# Patient Record
Sex: Male | Born: 1995 | Race: Black or African American | Hispanic: No | Marital: Single | Smoking: Never smoker
Health system: Southern US, Community
[De-identification: ages and names within clinical notes are randomized; demographics above are authoritative.]

---
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
## 2019-06-23 ENCOUNTER — Encounter (HOSPITAL_COMMUNITY): Payer: Self-pay | Admitting: Emergency Medicine

## 2019-06-23 ENCOUNTER — Emergency Department (HOSPITAL_COMMUNITY)
Admission: EM | Admit: 2019-06-23 | Discharge: 2019-06-24 | Disposition: A | Discharge status: Home / Self Care | Payer: BC Managed Care – PPO | Attending: Emergency Medicine | Admitting: Emergency Medicine

## 2019-06-23 ENCOUNTER — Other Ambulatory Visit: Payer: Self-pay

## 2019-06-23 ENCOUNTER — Emergency Department (HOSPITAL_COMMUNITY): Payer: BC Managed Care – PPO

## 2019-06-23 DIAGNOSIS — R05 Cough: Secondary | ICD-10-CM | POA: Diagnosis present

## 2019-06-23 DIAGNOSIS — R0602 Shortness of breath: Secondary | ICD-10-CM | POA: Insufficient documentation

## 2019-06-23 DIAGNOSIS — Z5321 Procedure and treatment not carried out due to patient leaving prior to being seen by health care provider: Secondary | ICD-10-CM | POA: Insufficient documentation

## 2019-06-23 NOTE — ED Triage Notes (Signed)
Pt c/o cough with phlegm-clear and yellow and sob that started today. Pt reports scratchy throat today as well.

## 2019-06-24 NOTE — ED Notes (Signed)
Pt called 3X for room call. No answer. Eloped from waiting area. VS stable.

## 2019-10-01 ENCOUNTER — Ambulatory Visit: Payer: BC Managed Care – PPO | Attending: Family

## 2019-10-01 DIAGNOSIS — Z23 Encounter for immunization: Secondary | ICD-10-CM

## 2019-10-22 ENCOUNTER — Ambulatory Visit: Payer: BC Managed Care – PPO | Attending: Family

## 2019-10-22 DIAGNOSIS — Z23 Encounter for immunization: Secondary | ICD-10-CM

## 2019-10-25 NOTE — Progress Notes (Signed)
   Covid-19 Vaccination Clinic  Name:  RINGO SHEROD    MRN: [id number] DOB: [birth year]  10/25/2019  Mr. Callaham was observed post Covid-19 immunization for 15 minutes without incident. He was provided with Vaccine Information Sheet and instruction to access the V-Safe system.   Mr. Alexopoulos was instructed to call 911 with any severe reactions post vaccine: Marland Kitchen Difficulty breathing  . Swelling of face and throat  . A fast heartbeat  . A bad rash all over body  . Dizziness and weakness   Immunizations Administered    Name Date Dose VIS Date Route   Pfizer COVID-19 Vaccine 10/01/2019 10:00 AM 0.3 mL 03/11/2018 Intramuscular   Manufacturer: [employer], [employer]   Lot: [id number]   NDC: [address]-3

## 2019-11-26 NOTE — Progress Notes (Signed)
   Covid-19 Vaccination Clinic  Name:  Joel Davidson    MRN: [id number] DOB: 02/18/1995  11/26/2019  [name] was observed post Covid-19 immunization for 15 minutes without incident. He was provided with Vaccine Information Sheet and instruction to access the V-Safe system.   [name] was instructed to call 911 with any severe reactions post vaccine: Marland Kitchen Difficulty breathing  . Swelling of face and throat  . A fast heartbeat  . A bad rash all over body  . Dizziness and weakness   Immunizations Administered    Name Date Dose VIS Date Route   Pfizer COVID-19 Vaccine 10/22/2019  1:00 PM 0.3 mL 11/04/2019 Intramuscular   Manufacturer: [employer], [employer]   Lot: [id number]   NDC: [address]-8

## 2020-05-20 ENCOUNTER — Other Ambulatory Visit: Payer: Self-pay

## 2020-05-20 ENCOUNTER — Ambulatory Visit
Admission: RE | Admit: 2020-05-20 | Discharge: 2020-05-20 | Disposition: A | Discharge status: Home / Self Care | Payer: BC Managed Care – PPO | Source: Ambulatory Visit | Attending: Internal Medicine | Admitting: Internal Medicine

## 2020-05-20 ENCOUNTER — Other Ambulatory Visit: Payer: Self-pay | Admitting: Internal Medicine

## 2020-05-20 DIAGNOSIS — M79642 Pain in left hand: Secondary | ICD-10-CM

## 2021-11-11 IMAGING — CR DG WRIST 2V*L*
2 series · 2 of 2 positions shown · non-contrast
Comparison: None.

CLINICAL DATA: Left wrist pain after fall 1 week prior playing
basketball

EXAM:
LEFT WRIST - 2 VIEW

[x wrist pa left]
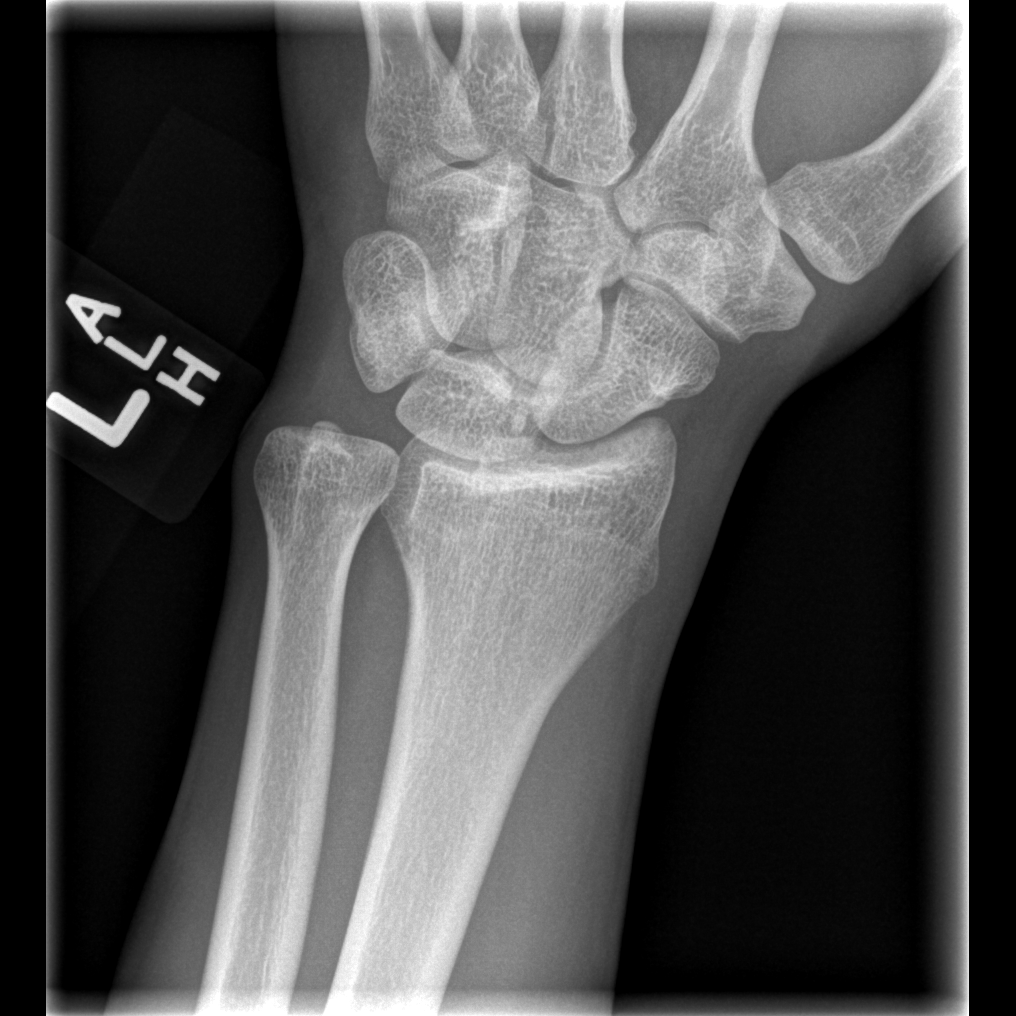

[x wrist lat left]
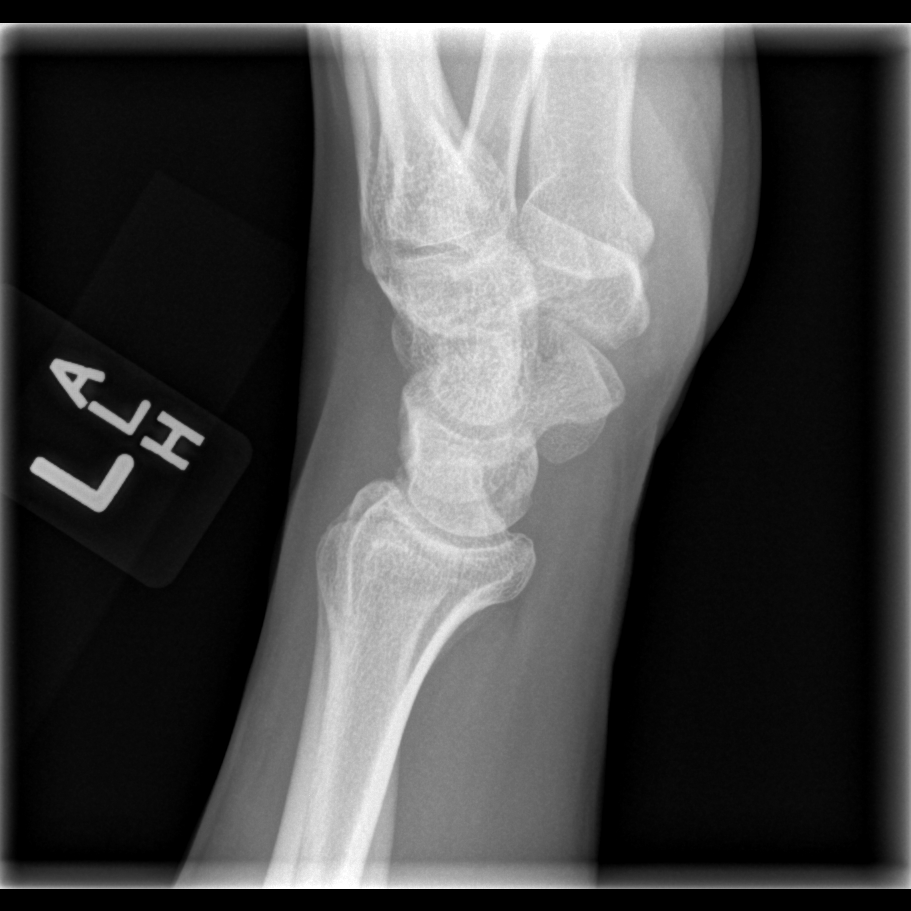

[2 of 2 positions shown; findings below may reference images not displayed]

FINDINGS: There is no evidence of fracture or dislocation. There is no
evidence of arthropathy or other focal bone abnormality. Soft
tissues are unremarkable.
IMPRESSION: No acute osseous abnormality in the left wrist.

## 2022-02-08 ENCOUNTER — Ambulatory Visit: Payer: Self-pay

## 2022-02-08 NOTE — Telephone Encounter (Signed)
   Chief Complaint: tooth pain, facial swelling Symptoms: pain in tooth, facial swelling- R eye swelling- patient believes he has a sinus infection Frequency: started Friday Pertinent Negatives: Patient denies fever Disposition: [] ED /[x] Urgent Care (no appt availability in office) / [] Appointment(In office/virtual)/ []  [facility]/ [] Home Care/ [] Refused Recommended Disposition /[] [facility]/ []  Follow-up with PCP Additional Notes: Patient advised to call PCP for appointment- if no available appointment then UC would be option   Reason for Disposition  Face is very swollen  Answer Assessment - Initial Assessment Questions 1. LOCATION: "Which tooth is hurting?"  (e.g., right-side/left-side, upper/lower, front/back)     R side of face swelling- toothache 2. ONSET: "When did the toothache start?"  (e.g., hours, days)      Last Friday started 3. SEVERITY: "How bad is the toothache?"  (Scale 1-10; mild, moderate or severe)   - MILD (1-3): doesn't interfere with chewing    - MODERATE (4-7): interferes with chewing, interferes with normal activities, awakens from sleep     - SEVERE (8-10): unable to eat, unable to do any normal activities, excruciating pain        moderate 4. SWELLING: "Is there any visible swelling of your face?"     Yes- swelling in face and eye- R 5. OTHER SYMPTOMS: "Do you have any other symptoms?" (e.g., fever)     Sinus symptoms  Protocols used: Toothache-A-AH

## 2022-02-08 NOTE — Telephone Encounter (Signed)
Summary: Swelling right side of face and eye   Pt is calling to report swelling in the the right side of his face, tooth ache in the front right side of mouth, with right eye swelling, with no pain anywhere. Please advise        Called pt at both numbers - [facility]

## 2022-02-12 ENCOUNTER — Other Ambulatory Visit (HOSPITAL_COMMUNITY): Payer: Self-pay

## 2022-02-12 ENCOUNTER — Emergency Department (HOSPITAL_COMMUNITY)
Admission: EM | Admit: 2022-02-12 | Discharge: 2022-02-12 | Disposition: A | Discharge status: Home / Self Care | Payer: BC Managed Care – PPO | Attending: Emergency Medicine | Admitting: Emergency Medicine

## 2022-02-12 ENCOUNTER — Encounter (HOSPITAL_COMMUNITY): Payer: Self-pay | Admitting: *Deleted

## 2022-02-12 ENCOUNTER — Other Ambulatory Visit: Payer: Self-pay

## 2022-02-12 DIAGNOSIS — R0981 Nasal congestion: Secondary | ICD-10-CM

## 2022-02-12 DIAGNOSIS — J0101 Acute recurrent maxillary sinusitis: Secondary | ICD-10-CM | POA: Diagnosis not present

## 2022-02-12 DIAGNOSIS — R220 Localized swelling, mass and lump, head: Secondary | ICD-10-CM | POA: Diagnosis present

## 2022-02-12 MED ORDER — AMOXICILLIN-POT CLAVULANATE 875-125 MG PO TABS
1.0000 | ORAL_TABLET | Freq: Two times a day (BID) | ORAL | 0 refills | Status: DC
  Filled 2022-02-12: qty 14, 7d supply, fill #0

## 2022-02-12 NOTE — ED Provider Notes (Signed)
[facility] Provider Note   CSN: [id number] Arrival date & time: 02/12/22  0093     History  Chief Complaint  Patient presents with   Facial Pain    Joel Davidson is a 27 y.o. male.  Joel Davidson is a 27 year old male past medical history of remote right-sided facial abscess presenting to the emergency department for intermittent right-sided facial swelling.  Patient states that over the past 4 weeks he has had intermittent right-sided nasal congestion that has gradually spread to include his sinuses on the right side of his face.  Denies fever or chills.  He states that he has been taking Zyrtec with little relief.  He states that he has had some clear discharge from the right nare.  Denies any trauma to the face.  Patient denies any focal neurological deficits denies any right-sided vision changes or hearing change to the right ear.         Home Medications Prior to Admission medications   Medication Sig Start Date End Date Taking? Authorizing Provider  amoxicillin-clavulanate (AUGMENTIN) 875-125 MG tablet Take 1 tablet by mouth 2 (two) times daily. 02/12/22  Yes Donzetta Matters, MD      Allergies    Patient has no known allergies.    Review of Systems   Review of Systems  Physical Exam Updated Vital Signs BP (!) 144/84   Pulse 83   Temp 98.1 F (36.7 C) (Oral)   Resp 16   SpO2 96%  Physical Exam Vitals and nursing note reviewed.  Constitutional:      General: He is not in acute distress.    Appearance: He is well-developed.  HENT:     Head: Normocephalic and atraumatic.     Right Ear: Tympanic membrane normal.     Left Ear: Tympanic membrane normal.     Nose: Congestion and rhinorrhea present.  Eyes:     Conjunctiva/sclera: Conjunctivae normal.  Cardiovascular:     Rate and Rhythm: Normal rate and regular rhythm.     Heart sounds: No murmur heard. Pulmonary:     Effort: Pulmonary effort is normal. No  respiratory distress.     Breath sounds: Normal breath sounds.  Abdominal:     Palpations: Abdomen is soft.     Tenderness: There is no abdominal tenderness.  Musculoskeletal:        General: No swelling.     Cervical back: Neck supple.  Skin:    General: Skin is warm and dry.     Capillary Refill: Capillary refill takes less than 2 seconds.  Neurological:     Mental Status: He is alert.  Psychiatric:        Mood and Affect: Mood normal.     ED Results / Procedures / Treatments   Labs (all labs ordered are listed, but only abnormal results are displayed) Labs Reviewed - No data to display  EKG None  Radiology No results found.  Procedures Procedures    Medications Ordered in ED Medications - No data to display  ED Course/ Medical Decision Making/ A&P                             Medical Decision Making Joel Davidson is an otherwise healthy 27 year old male past medical history as documented above presenting to the emergency department for right-sided facial pain and swelling.  On arrival to the emergency department patient is  slightly hypertensive, afebrile, nontachycardic satting 96% on room air.  On my initial assessment of the patient there is no frank swelling noted on the patient's right face.  On inspection of the patient's nose there is clear runny mucus in the right nare no evidence of large polyp or bleeding.  Patient's ear shows normal tympanic membrane no swelling or inflammation eyes are equal round and reactive no afferent pupillary defect noted patient's extraocular movements are grossly intact no scleral injection noted.  Patient symptoms are intermittent and mild and I believe this is a vascular issue such as SVC or Horner syndrome.  Seems more likely a allergic rhinitis given the clear nasal discharge however given the length of symptoms we are opting to treat the patient as an acute rhinosinusitis patient is already taking Zyrtec at home he was  instructed to begin taking Flonase and we will provide him with 7-day course of amoxicillin/clavulanate.  Ambulatory referral was placed to ENT.  Patient was generally well-appearing no vital sign or rales in the emergency department and otherwise healthy therefore discharged home with self-care.     Patient does have minor maxillary and sphenoid/ethmoid sinus tenderness on the right side.    Risk Prescription drug management.           Final Clinical Impression(s) / ED Diagnoses Final diagnoses:  Sinus congestion  Acute recurrent maxillary sinusitis    Rx / DC Orders ED Discharge Orders          Ordered    amoxicillin-clavulanate (AUGMENTIN) 875-125 MG tablet  2 times daily        02/12/22 1620              Donzetta Matters, MD 02/12/22 2359    Margette Fast, MD 02/13/22 [phone]

## 2022-02-12 NOTE — ED Notes (Signed)
DC instructions reviewed with pt. Pt verbalized understanding.  PT DC.  

## 2022-02-12 NOTE — Discharge Instructions (Addendum)
Please continue to monitor your symptoms, if you notice vision changes please return to the ED.  Please take Augmentin twice daily for the next 7 days.  Please begin using flonase twice daily Sincerely, Clydie Braun, PGY-2

## 2022-02-12 NOTE — ED Triage Notes (Signed)
Pt states since last Saturday his right side of the face has been swelling and now his right nostril he is unable to blow anything out of his right nostril

## 2023-09-30 ENCOUNTER — Other Ambulatory Visit: Payer: Self-pay

## 2023-09-30 ENCOUNTER — Encounter (HOSPITAL_BASED_OUTPATIENT_CLINIC_OR_DEPARTMENT_OTHER): Payer: Self-pay

## 2023-09-30 ENCOUNTER — Emergency Department (HOSPITAL_BASED_OUTPATIENT_CLINIC_OR_DEPARTMENT_OTHER): Payer: Self-pay | Admitting: Radiology

## 2023-09-30 ENCOUNTER — Emergency Department (HOSPITAL_BASED_OUTPATIENT_CLINIC_OR_DEPARTMENT_OTHER)
Admission: EM | Admit: 2023-09-30 | Discharge: 2023-09-30 | Disposition: A | Discharge status: Home / Self Care | Payer: Self-pay | Attending: Emergency Medicine | Admitting: Emergency Medicine

## 2023-09-30 DIAGNOSIS — M25532 Pain in left wrist: Secondary | ICD-10-CM | POA: Insufficient documentation

## 2023-09-30 DIAGNOSIS — W19XXXA Unspecified fall, initial encounter: Secondary | ICD-10-CM | POA: Insufficient documentation

## 2023-09-30 DIAGNOSIS — M79642 Pain in left hand: Secondary | ICD-10-CM | POA: Insufficient documentation

## 2023-09-30 DIAGNOSIS — Y9367 Activity, basketball: Secondary | ICD-10-CM | POA: Insufficient documentation

## 2023-09-30 MED ORDER — IBUPROFEN 400 MG PO TABS
600.0000 mg | ORAL_TABLET | Freq: Once | ORAL | Status: AC
  Administered 2023-09-30: 600 mg via ORAL
  Filled 2023-09-30: qty 1

## 2023-09-30 NOTE — ED Triage Notes (Signed)
 Pt c/o L hand pain, advises he was playing basketball 2wks ago, my L hand has been swollen since. +ROM, but the swelling hasn't gone down. Advsies that sometimes it hurts w movement, pulling pain

## 2023-09-30 NOTE — ED Provider Notes (Signed)
  [place] EMERGENCY DEPARTMENT AT [facility] Provider Note   CSN: [id number] Arrival date & time: 09/30/23  1115     Patient presents with: Hand Pain  HPI JAMONTA GOERNER is a 28 y.o. male presenting for left hand pain.  Started 2 weeks ago while playing basketball.  He states he lost his footing and fell forward landing on his left hand with it outstretched.  Has noted some persistent swelling and some pain primarily about the thenar eminence of the palm extending down past the wrist.  He states he still able to move it normally.  Has been icing it occasionally.    Hand Pain       Prior to Admission medications   Medication Sig Start Date End Date Taking? Authorizing Provider  amoxicillin -clavulanate (AUGMENTIN ) 875-125 MG tablet Take 1 tablet by mouth 2 (two) times daily. 02/12/22   Jackquline Chew, MD    Allergies: Patient has no known allergies.    Review of Systems See HPI  Updated Vital Signs BP 128/76 (BP Location: Right Arm)   Pulse 66   Temp 97.9 F (36.6 C)   Resp 16   SpO2 100%   Physical Exam Constitutional:      Appearance: Normal appearance.  HENT:     Head: Normocephalic.     Nose: Nose normal.  Eyes:     Conjunctiva/sclera: Conjunctivae normal.  Pulmonary:     Effort: Pulmonary effort is normal.  Musculoskeletal:     Comments: Left hand appears normal without swelling erythema or ecchymosis or obvious deformity.  Radial pulses 2+.  Cap refills brisk distally.  Sensation intact to light touch.  No snuffbox tenderness but there is some tenderness with palpation to the distal radial forearm.  Neurological:     Mental Status: He is alert.  Psychiatric:        Mood and Affect: Mood normal.     (all labs ordered are listed, but only abnormal results are displayed) Labs Reviewed - No data to display  EKG: None  Radiology: DG Hand Complete Left Result Date: 09/30/2023 CLINICAL DATA:  Left hand pain. EXAM: LEFT HAND - COMPLETE 3+  VIEW COMPARISON:  None Available. FINDINGS: There is no evidence of fracture or dislocation. Joint spaces are maintained. Soft tissues are unremarkable. IMPRESSION: Negative. Electronically Signed   By: Harrietta Sherry M.D.   On: 09/30/2023 12:04     Procedures   Medications Ordered in the ED  ibuprofen  (ADVIL ) tablet 600 mg (has no administration in time range)                                    Medical Decision Making Amount and/or Complexity of Data Reviewed Radiology: ordered.   28 year old well-appearing male presenting for left hand pain.  Exam is unremarkable.  It is possible he may have sustained a very mild wrist sprain.  Placed a brace.  Advised RICE treatment and NSAIDs at home and to follow-up with his PCP.  Discharged.     Final diagnoses:  Pain of left hand  Left wrist pain    ED Discharge Orders     None          Lang Norleen POUR, PA-C 09/30/23 1352    Geraldene Hamilton, MD 10/01/23 1254

## 2023-09-30 NOTE — Discharge Instructions (Signed)
 Evaluation today revealed that you may have possibly sprained your left wrist mildly.  Treatment is mostly supportive at home which includes rest, ice, compression and elevation and take ibuprofen  as well to help with the inflammation.  You can use the brace for comfort.  Please follow-up with your PCP.

## 2023-09-30 NOTE — ED Notes (Signed)

## 2023-12-12 ENCOUNTER — Emergency Department (HOSPITAL_COMMUNITY): Payer: Self-pay

## 2023-12-12 ENCOUNTER — Encounter (HOSPITAL_COMMUNITY): Payer: Self-pay

## 2023-12-12 ENCOUNTER — Emergency Department (HOSPITAL_COMMUNITY)
Admission: EM | Admit: 2023-12-12 | Discharge: 2023-12-12 | Disposition: A | Discharge status: Home / Self Care | Payer: Self-pay | Attending: Emergency Medicine | Admitting: Emergency Medicine

## 2023-12-12 ENCOUNTER — Other Ambulatory Visit: Payer: Self-pay

## 2023-12-12 DIAGNOSIS — L0291 Cutaneous abscess, unspecified: Secondary | ICD-10-CM

## 2023-12-12 DIAGNOSIS — K610 Anal abscess: Secondary | ICD-10-CM | POA: Insufficient documentation

## 2023-12-12 LAB — BASIC METABOLIC PANEL WITH GFR
Anion gap: 10 (ref 5–15)
BUN: 5 mg/dL — ABNORMAL LOW (ref 6–20)
CO2: 26 mmol/L (ref 22–32)
Calcium: 9.2 mg/dL (ref 8.9–10.3)
Chloride: 104 mmol/L (ref 98–111)
Creatinine, Ser: 0.99 mg/dL (ref 0.61–1.24)
GFR, Estimated: >60 mL/min (ref >60)
Glucose, Bld: 96 mg/dL (ref 70–99)
Potassium: 3.7 mmol/L (ref 3.5–5.1)
Sodium: 140 mmol/L (ref 135–145)

## 2023-12-12 LAB — CBC WITH DIFFERENTIAL/PLATELET
Abs Immature Granulocytes: 0.04 K/uL (ref 0.00–0.07)
Basophils Absolute: 0.1 K/uL (ref 0.0–0.1)
Basophils Relative: 1 %
Eosinophils Absolute: 0.1 K/uL (ref 0.0–0.5)
Eosinophils Relative: 1 %
HCT: 42.8 % (ref 39.0–52.0)
Hemoglobin: 14 g/dL (ref 13.0–17.0)
Immature Granulocytes: 1 %
Lymphocytes Relative: 21 %
Lymphs Abs: 1.7 K/uL (ref 0.7–4.0)
MCH: 29.8 pg (ref 26.0–34.0)
MCHC: 32.7 g/dL (ref 30.0–36.0)
MCV: 91.1 fL (ref 80.0–100.0)
Monocytes Absolute: 0.7 K/uL (ref 0.1–1.0)
Monocytes Relative: 8 %
Neutro Abs: 5.9 K/uL (ref 1.7–7.7)
Neutrophils Relative %: 68 %
Platelets: 349 K/uL (ref 150–400)
RBC: 4.7 MIL/uL (ref 4.22–5.81)
RDW: 12.5 % (ref 11.5–15.5)
WBC: 8.5 K/uL (ref 4.0–10.5)
nRBC: 0 % (ref 0.0–0.2)

## 2023-12-12 LAB — I-STAT CHEM 8, ED
BUN: 5 mg/dL — ABNORMAL LOW (ref 6–20)
Calcium, Ion: 1.2 mmol/L (ref 1.15–1.40)
Chloride: 104 mmol/L (ref 98–111)
Creatinine, Ser: 1 mg/dL (ref 0.61–1.24)
Glucose, Bld: 93 mg/dL (ref 70–99)
HCT: 41 % (ref 39.0–52.0)
Hemoglobin: 13.9 g/dL (ref 13.0–17.0)
Potassium: 3.8 mmol/L (ref 3.5–5.1)
Sodium: 142 mmol/L (ref 135–145)
TCO2: 26 mmol/L (ref 22–32)

## 2023-12-12 MED ORDER — AMOXICILLIN-POT CLAVULANATE 875-125 MG PO TABS
1.0000 | ORAL_TABLET | Freq: Once | ORAL | Status: DC
  Filled 2023-12-12: qty 1

## 2023-12-12 MED ORDER — IOHEXOL 350 MG/ML SOLN
75.0000 mL | Freq: Once | INTRAVENOUS | Status: AC | PRN
  Administered 2023-12-12: 75 mL via INTRAVENOUS

## 2023-12-12 MED ORDER — LIDOCAINE-EPINEPHRINE (PF) 2 %-1:200000 IJ SOLN
10.0000 mL | Freq: Once | INTRAMUSCULAR | Status: DC
  Filled 2023-12-12: qty 20

## 2023-12-12 MED ORDER — OXYCODONE HCL 5 MG PO TABS
5.0000 mg | ORAL_TABLET | Freq: Once | ORAL | Status: AC
Start: 2023-12-12 — End: 2023-12-12
  Administered 2023-12-12: 5 mg via ORAL
  Filled 2023-12-12: qty 1

## 2023-12-12 MED ORDER — AMOXICILLIN-POT CLAVULANATE 875-125 MG PO TABS: 1.0000 | ORAL_TABLET | Freq: Two times a day (BID) | ORAL | 0 refills | Status: AC

## 2023-12-12 NOTE — ED Provider Notes (Signed)
 [place] EMERGENCY DEPARTMENT AT [facility] Provider Note   CSN: [id number] Arrival date & time: 12/12/23  1119     Patient presents with: Abscess   Joel Davidson is a 28 y.o. male with noncontributory PMHx who presents to ED concern for abscess developing in intergluteal cleft over the past 3 days.  There is pain associated with this area.  Patient denies any other infectious symptoms such as fever, chest pain, SOB, nausea, vomiting, diarrhea.    Abscess      Prior to Admission medications   Medication Sig Start Date End Date Taking? Authorizing Provider  amoxicillin -clavulanate (AUGMENTIN ) 875-125 MG tablet Take 1 tablet by mouth every 12 (twelve) hours for 7 days. 12/12/23 12/19/23 Yes Hoy Nidia FALCON, PA-C    Allergies: Patient has no known allergies.    Review of Systems  Skin:        Abscess    Updated Vital Signs BP (!) 129/90 (BP Location: Left Arm)   Pulse 79   Temp 97.6 F (36.4 C) (Oral)   Resp 18   Ht 6' 3 (1.905 m)   Wt 79.4 kg   SpO2 100%   BMI 21.87 kg/m   Physical Exam Vitals and nursing note reviewed.  Constitutional:      General: He is not in acute distress.    Appearance: He is not ill-appearing or toxic-appearing.  HENT:     Head: Normocephalic and atraumatic.     Mouth/Throat:     Mouth: Mucous membranes are moist.  Eyes:     General: No scleral icterus.       Right eye: No discharge.        Left eye: No discharge.     Conjunctiva/sclera: Conjunctivae normal.  Cardiovascular:     Rate and Rhythm: Normal rate.     Pulses: Normal pulses.  Pulmonary:     Effort: Pulmonary effort is normal.  Musculoskeletal:     Right lower leg: No edema.     Left lower leg: No edema.  Skin:    General: Skin is warm and dry.     Findings: No rash.     Comments: 2-3 cm area of fluctuance between intragluteal cleft superior to rectum. No drainage or surrounding erythema.  Neurological:     General: No focal deficit present.      Mental Status: He is alert. Mental status is at baseline.  Psychiatric:        Mood and Affect: Mood normal.     (all labs ordered are listed, but only abnormal results are displayed) Labs Reviewed  BASIC METABOLIC PANEL WITH GFR - Abnormal; Notable for the following components:      Result Value   BUN 5 (*)    All other components within normal limits  I-STAT CHEM 8, ED - Abnormal; Notable for the following components:   BUN 5 (*)    All other components within normal limits  CBC WITH DIFFERENTIAL/PLATELET  GC/CHLAMYDIA PROBE AMP ([place]) NOT AT [facility]    EKG: None  Radiology: CT PELVIS W CONTRAST Result Date: 12/12/2023 CLINICAL DATA:  Suspicion for perianal fistula or abscess or pilonidal cyst. EXAM: CT PELVIS WITH CONTRAST TECHNIQUE: Multidetector CT imaging of the pelvis was performed using the standard protocol following the bolus administration of intravenous contrast. RADIATION DOSE REDUCTION: This exam was performed according to the departmental dose-optimization program which includes automated exposure control, adjustment of the mA and/or kV according to patient size and/or use  of iterative reconstruction technique. CONTRAST:  75mL OMNIPAQUE  IOHEXOL  350 MG/ML SOLN COMPARISON:  None Available. FINDINGS: Urinary Tract:  Normal bladder and visualized ureters. Bowel:  Visualized bowel within normal limits. Vascular/Lymphatic: No vascular abnormality. No enlarged lymph nodes. Reproductive:  Unremarkable. Other: Ill-defined low-attenuation collection extends posteriorly from the anus, well below the levator sling and below the coccyx, measuring approximately 3.7 x 1.9 x 3.3 cm. There is adjacent fat stranding and haziness consistent with inflammation. Musculoskeletal: Normal. IMPRESSION: 1. 3.7 cm ill-defined fluid collection with adjacent inflammation extending posteriorly from the anus consistent with a perianal abscess. Electronically Signed   By: Alm Parkins M.D.   On:  12/12/2023 13:19     .Incision and Drainage  Date/Time: 12/12/2023 2:57 PM  Performed by: Hoy Nidia FALCON, PA-C Authorized by: Hoy Nidia FALCON, PA-C   Consent:    Consent obtained:  Verbal   Consent given by:  Patient   Risks discussed:  Bleeding, damage to other organs, incomplete drainage, infection and pain   Alternatives discussed:  No treatment Universal protocol:    Procedure explained and questions answered to patient or proxy's satisfaction: yes     Patient identity confirmed:  Verbally with patient Location:    Type:  Abscess   Location:  Anogenital   Anogenital location:  Perianal Pre-procedure details:    Skin preparation:  Povidone-iodine Anesthesia:    Anesthesia method:  Local infiltration   Local anesthetic:  Lidocaine  2% WITH epi Procedure type:    Complexity:  Simple Procedure details:    Incision types:  Stab incision   Incision depth:  Dermal   Drainage:  Purulent   Drainage amount:  Moderate   Wound treatment:  Wound left open Post-procedure details:    Procedure completion:  Tolerated well, no immediate complications Comments:     Dressing applied    Medications Ordered in the ED  lidocaine -EPINEPHrine  (XYLOCAINE  W/EPI) 2 %-1:200000 (PF) injection 10 mL (has no administration in time range)  amoxicillin -clavulanate (AUGMENTIN ) 875-125 MG per tablet 1 tablet (has no administration in time range)  iohexol  (OMNIPAQUE ) 350 MG/ML injection 75 mL (75 mLs Intravenous Contrast Given 12/12/23 1309)  oxyCODONE  (Oxy IR/ROXICODONE ) immediate release tablet 5 mg (5 mg Oral Given 12/12/23 1443)                                    Medical Decision Making Amount and/or Complexity of Data Reviewed Labs: ordered. Radiology: ordered.  Risk Prescription drug management.    This patient presents to the ED for concern of abscess, this involves an extensive number of treatment options, and is a complaint that carries with it a high risk of  complications and morbidity.  The differential diagnosis includes cellulitis, abscess, sepsis, folliculitis, necrotizing fasciitis, impetigo   Co morbidities that complicate the patient evaluation  None   Additional history obtained:  No PCP listed in chart   Problem List / ED Course / Critical interventions / Medication management  Patient presents to ED concerned for intergluteal cleft abscess. Symptoms started 3 days ago. CBC without leukocytosis or anemia.  BMP reassuring.  CT abdomen pelvis concerning for 3.7 cm perianal abscess. Abscess successfully drained without complications. Area n/v intact. Provided patient with a dose of Augmentin  in ED and sending rest of prescription to pharmacy.  Recommended following up with general surgery.  Patient agreeable to plan.  Educated patient on alternating Tylenol and Advil  for pain  control. Staffed with Dr. Patsey. I have reviewed the patients home medicines and have made adjustments as needed. The patient has been appropriately medically screened and/or stabilized in the ED. I have low suspicion for any other emergent medical condition which would require further screening, evaluation or treatment in the ED or require inpatient management. At time of discharge the patient is hemodynamically stable and in no acute distress. I have discussed work-up results and diagnosis with patient and answered all questions. Patient is agreeable with discharge plan. We discussed strict return precautions for returning to the emergency department and they verbalized understanding.     Social Determinants of Health:  none      Final diagnoses:  Abscess    ED Discharge Orders          Ordered    amoxicillin -clavulanate (AUGMENTIN ) 875-125 MG tablet  Every 12 hours        12/12/23 1459               Hoy Nidia FALCON, PA-C 12/12/23 1501    Patsey Lot, MD 12/13/23 [phone]

## 2023-12-12 NOTE — Discharge Instructions (Addendum)
 As discussed, please follow-up with primary care and general surgery.  Seek emergency care if experiencing any new or worsening symptoms.  Alternating between 650 mg Tylenol and 400 mg Advil : The best way to alternate taking Acetaminophen (example Tylenol) and Ibuprofen  (example Advil /Motrin ) is to take them 3 hours apart. For example, if you take ibuprofen  at 6 am you can then take Tylenol at 9 am. You can continue this regimen throughout the day, making sure you do not exceed the recommended maximum dose for each drug.

## 2023-12-12 NOTE — ED Triage Notes (Signed)
 C/O abscess to lower back that showed up 3 days ago. Denies fevers. Axox4. Denies drainage from site.

## 2023-12-13 LAB — GC/CHLAMYDIA PROBE AMP (~~LOC~~) NOT AT ARMC
Chlamydia: NEGATIVE
Comment: NEGATIVE
Comment: NORMAL
Neisseria Gonorrhea: NEGATIVE
